# Patient Record
Sex: Male | Born: 1935 | Race: White | Hispanic: No | Marital: Married | Smoking: Former smoker
Health system: Southern US, Community
[De-identification: ages and names within clinical notes are randomized; demographics above are authoritative.]

## PROBLEM LIST (undated history)

## (undated) DIAGNOSIS — IMO0002 Reserved for concepts with insufficient information to code with codable children: Secondary | ICD-10-CM

## (undated) DIAGNOSIS — I219 Acute myocardial infarction, unspecified: Secondary | ICD-10-CM

## (undated) DIAGNOSIS — R109 Unspecified abdominal pain: Secondary | ICD-10-CM

## (undated) DIAGNOSIS — M4800 Spinal stenosis, site unspecified: Secondary | ICD-10-CM

## (undated) DIAGNOSIS — I4891 Unspecified atrial fibrillation: Secondary | ICD-10-CM

## (undated) DIAGNOSIS — I714 Abdominal aortic aneurysm, without rupture: Secondary | ICD-10-CM

## (undated) DIAGNOSIS — T7840XA Allergy, unspecified, initial encounter: Secondary | ICD-10-CM

## (undated) DIAGNOSIS — I2510 Atherosclerotic heart disease of native coronary artery without angina pectoris: Secondary | ICD-10-CM

## (undated) DIAGNOSIS — J029 Acute pharyngitis, unspecified: Secondary | ICD-10-CM

## (undated) DIAGNOSIS — M1990 Unspecified osteoarthritis, unspecified site: Secondary | ICD-10-CM

## (undated) DIAGNOSIS — J45909 Unspecified asthma, uncomplicated: Secondary | ICD-10-CM

## (undated) DIAGNOSIS — D126 Benign neoplasm of colon, unspecified: Secondary | ICD-10-CM

## (undated) DIAGNOSIS — I10 Essential (primary) hypertension: Secondary | ICD-10-CM

## (undated) DIAGNOSIS — N189 Chronic kidney disease, unspecified: Secondary | ICD-10-CM

## (undated) DIAGNOSIS — N400 Enlarged prostate without lower urinary tract symptoms: Secondary | ICD-10-CM

## (undated) DIAGNOSIS — R809 Proteinuria, unspecified: Secondary | ICD-10-CM

## (undated) DIAGNOSIS — E785 Hyperlipidemia, unspecified: Secondary | ICD-10-CM

## (undated) DIAGNOSIS — I639 Cerebral infarction, unspecified: Secondary | ICD-10-CM

## (undated) DIAGNOSIS — J189 Pneumonia, unspecified organism: Secondary | ICD-10-CM

## (undated) DIAGNOSIS — I6529 Occlusion and stenosis of unspecified carotid artery: Secondary | ICD-10-CM

## (undated) DIAGNOSIS — D649 Anemia, unspecified: Secondary | ICD-10-CM

## (undated) DIAGNOSIS — I771 Stricture of artery: Secondary | ICD-10-CM

## (undated) DIAGNOSIS — J449 Chronic obstructive pulmonary disease, unspecified: Secondary | ICD-10-CM

## (undated) DIAGNOSIS — I509 Heart failure, unspecified: Secondary | ICD-10-CM

## (undated) DIAGNOSIS — C801 Malignant (primary) neoplasm, unspecified: Secondary | ICD-10-CM

## (undated) DIAGNOSIS — M94261 Chondromalacia, right knee: Secondary | ICD-10-CM

## (undated) HISTORY — DX: Pneumonia, unspecified organism: J18.9

## (undated) HISTORY — DX: Chronic obstructive pulmonary disease, unspecified: J44.9

## (undated) HISTORY — DX: Heart failure, unspecified: I50.9

## (undated) HISTORY — DX: Acute myocardial infarction, unspecified: I21.9

## (undated) HISTORY — DX: Stricture of artery: I77.1

## (undated) HISTORY — DX: Proteinuria, unspecified: R80.9

## (undated) HISTORY — DX: Benign neoplasm of colon, unspecified: D12.6

## (undated) HISTORY — DX: Reserved for concepts with insufficient information to code with codable children: IMO0002

## (undated) HISTORY — DX: Abdominal aortic aneurysm, without rupture: I71.4

## (undated) HISTORY — DX: Malignant (primary) neoplasm, unspecified: C80.1

## (undated) HISTORY — DX: Chondromalacia, right knee: M94.261

## (undated) HISTORY — PX: TURP VAPORIZATION: SUR1397

## (undated) HISTORY — DX: Unspecified atrial fibrillation: I48.91

## (undated) HISTORY — DX: Benign prostatic hyperplasia without lower urinary tract symptoms: N40.0

## (undated) HISTORY — DX: Allergy, unspecified, initial encounter: T78.40XA

## (undated) HISTORY — DX: Spinal stenosis, site unspecified: M48.00

## (undated) HISTORY — DX: Unspecified asthma, uncomplicated: J45.909

## (undated) HISTORY — PX: FRACTURE SURGERY: SHX138

## (undated) HISTORY — DX: Cerebral infarction, unspecified: I63.9

## (undated) HISTORY — DX: Atherosclerotic heart disease of native coronary artery without angina pectoris: I25.10

## (undated) HISTORY — DX: Occlusion and stenosis of unspecified carotid artery: I65.29

## (undated) HISTORY — DX: Anemia, unspecified: D64.9

## (undated) HISTORY — DX: Chronic kidney disease, unspecified: N18.9

## (undated) HISTORY — DX: Acute pharyngitis, unspecified: J02.9

## (undated) HISTORY — DX: Unspecified osteoarthritis, unspecified site: M19.90

## (undated) HISTORY — PX: CAROTID ENDARTERECTOMY: SUR193

## (undated) HISTORY — PX: COLONOSCOPY: SHX174

## (undated) HISTORY — DX: Essential (primary) hypertension: I10

## (undated) HISTORY — DX: Unspecified abdominal pain: R10.9

## (undated) HISTORY — DX: Hyperlipidemia, unspecified: E78.5

---

## 1983-07-04 DIAGNOSIS — IMO0002 Reserved for concepts with insufficient information to code with codable children: Secondary | ICD-10-CM

## 1983-07-04 HISTORY — DX: Reserved for concepts with insufficient information to code with codable children: IMO0002

## 1999-02-01 HISTORY — PX: COLON SURGERY: SHX602

## 1999-03-04 HISTORY — PX: OTHER SURGICAL HISTORY: SHX169

## 1999-05-04 DIAGNOSIS — J449 Chronic obstructive pulmonary disease, unspecified: Secondary | ICD-10-CM

## 1999-05-04 HISTORY — DX: Chronic obstructive pulmonary disease, unspecified: J44.9

## 2002-11-01 HISTORY — PX: CORONARY ARTERY BYPASS GRAFT: SHX141

## 2003-05-12 HISTORY — PX: NEPHRECTOMY: SHX65

## 2003-07-04 DIAGNOSIS — I714 Abdominal aortic aneurysm, without rupture, unspecified: Secondary | ICD-10-CM

## 2003-07-04 HISTORY — DX: Abdominal aortic aneurysm, without rupture, unspecified: I71.40

## 2003-07-04 HISTORY — DX: Abdominal aortic aneurysm, without rupture: I71.4

## 2004-08-16 DIAGNOSIS — N189 Chronic kidney disease, unspecified: Secondary | ICD-10-CM

## 2004-08-16 HISTORY — DX: Chronic kidney disease, unspecified: N18.9

## 2007-05-16 HISTORY — PX: ESOPHAGOGASTRODUODENOSCOPY: SHX1529

## 2008-01-13 HISTORY — PX: LAPAROSCOPIC PARTIAL COLECTOMY: SHX5907

## 2010-08-04 ENCOUNTER — Ambulatory Visit
Admission: RE | Admit: 2010-08-04 | Discharge: 2010-08-04 | Disposition: A | Discharge status: Home / Self Care | Payer: Medicare Other | Source: Ambulatory Visit | Attending: Orthopedic Surgery | Admitting: Orthopedic Surgery

## 2010-08-04 ENCOUNTER — Other Ambulatory Visit: Payer: Self-pay | Admitting: Orthopedic Surgery

## 2010-08-04 DIAGNOSIS — M79605 Pain in left leg: Secondary | ICD-10-CM

## 2012-05-07 HISTORY — PX: JOINT REPLACEMENT: SHX530

## 2012-09-09 IMAGING — US US EXTREM LOW VENOUS*L*
1 series · 14 of 24 positions shown · non-contrast
Comparison: None.

CLINICAL DATA: Pain

LEFT LOWER EXTREMITY VENOUS DUPLEX ULTRASOUND
TECHNIQUE: Gray-scale sonography with graded compression, as well
as color Doppler and duplex ultrasound, were performed to evaluate
the deep venous system of the lower extremity from the level of the
common femoral vein through the popliteal and proximal calf veins.
Spectral Doppler was utilized to evaluate flow at rest and with
distal augmentation maneuvers.

[Series 1: us extrem low venous*left* · 14 of 31 slices shown]
[im 1/31]
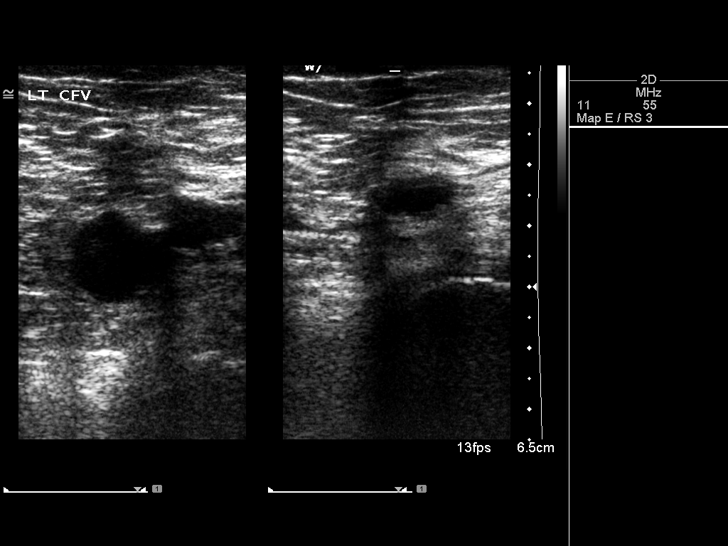
[im 3/31]
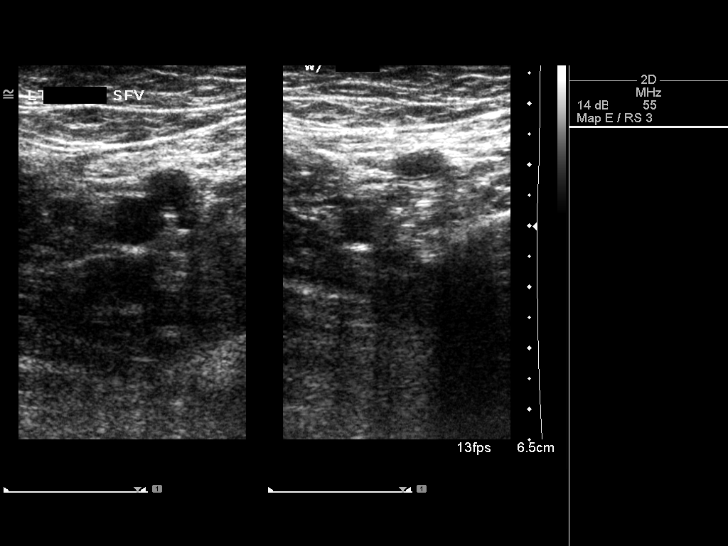
[im 6/31]
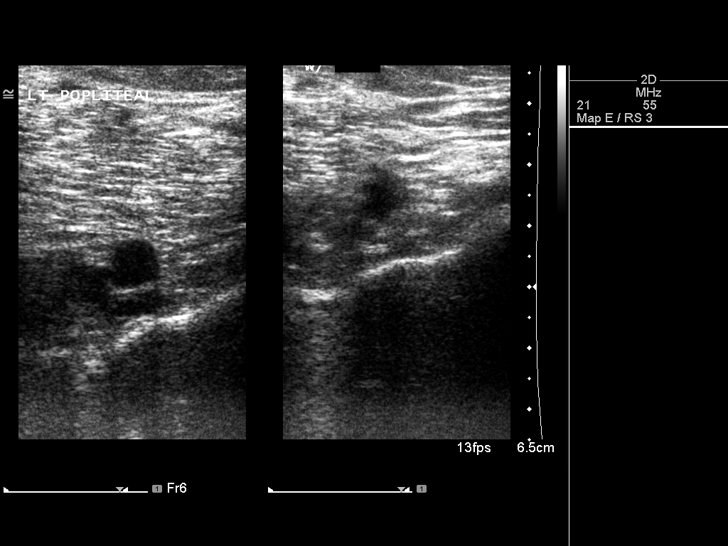
[im 8/31]
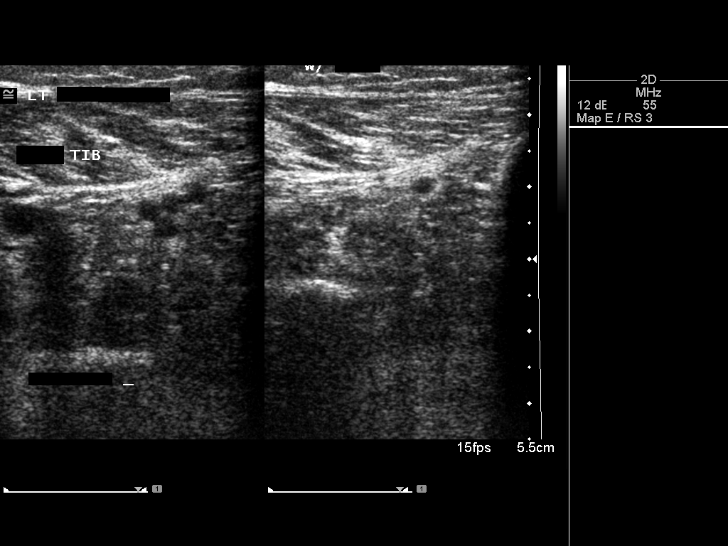
[im 10/31]
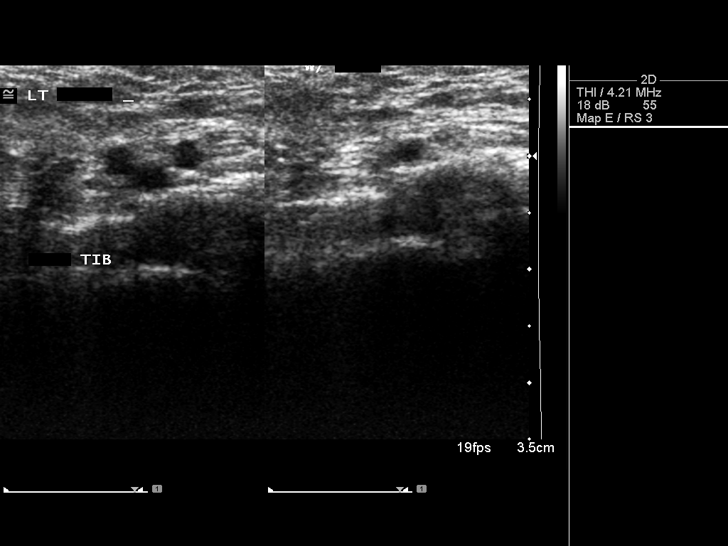
[im 12/31]
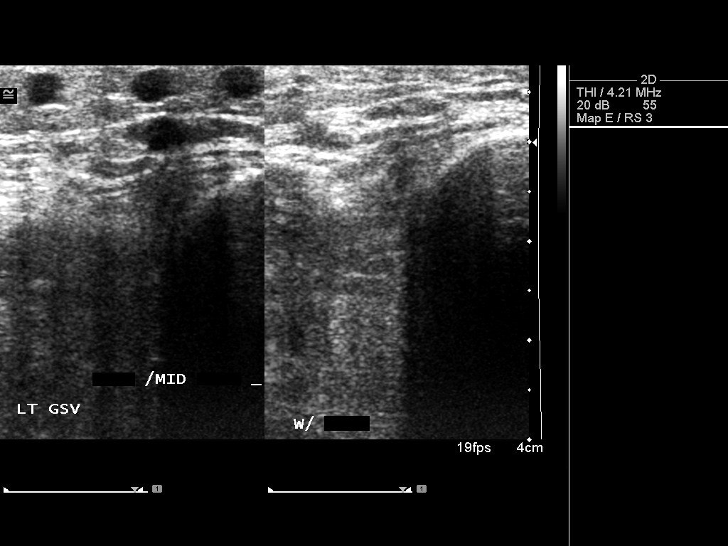
[im 15/31]
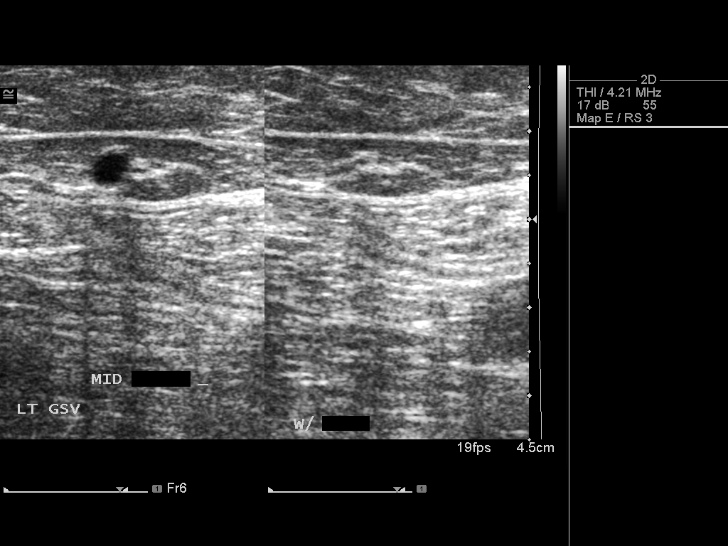
[im 16/31]
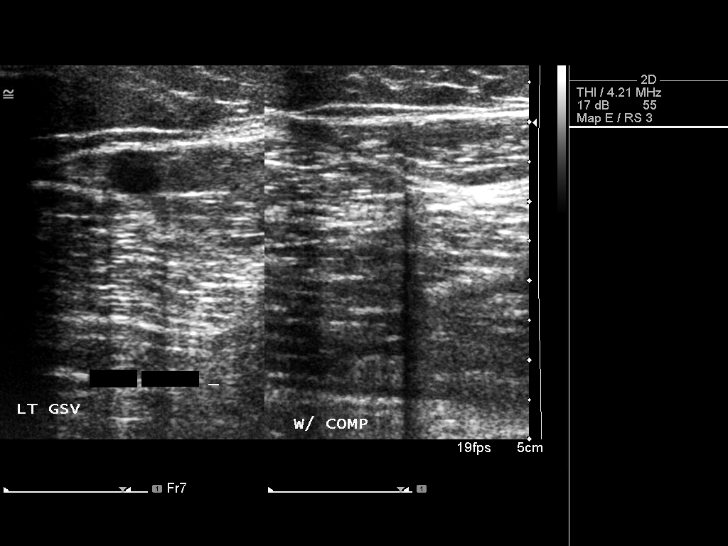
[im 19/31]
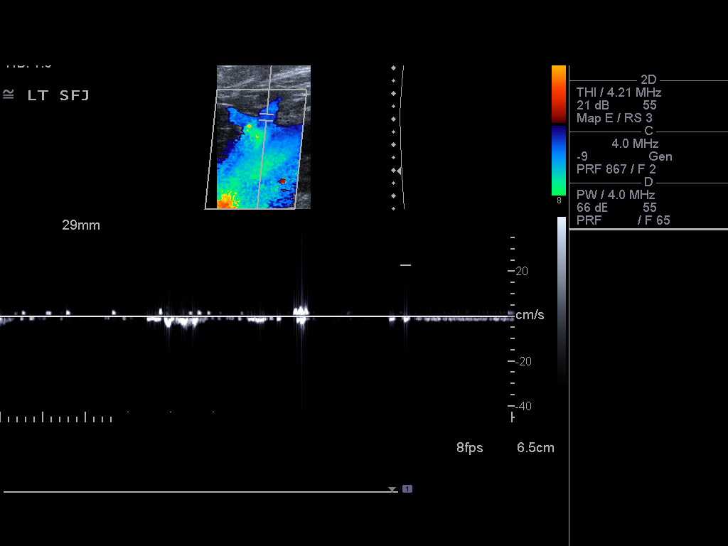
[im 21/31]
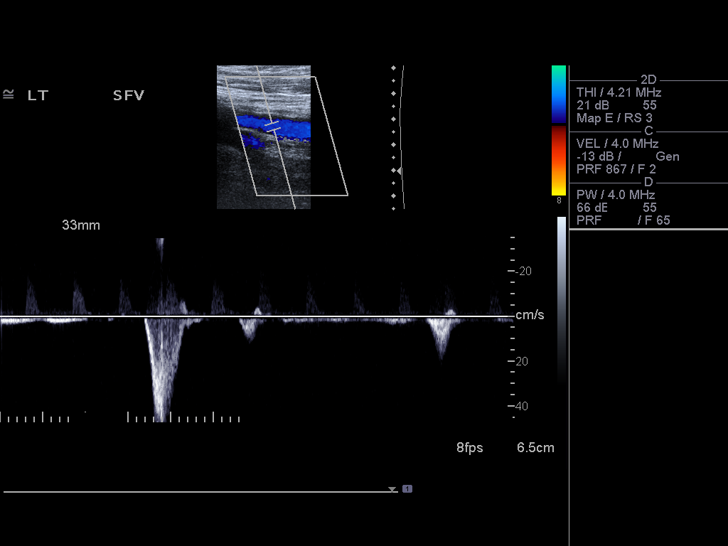
[im 24/31]
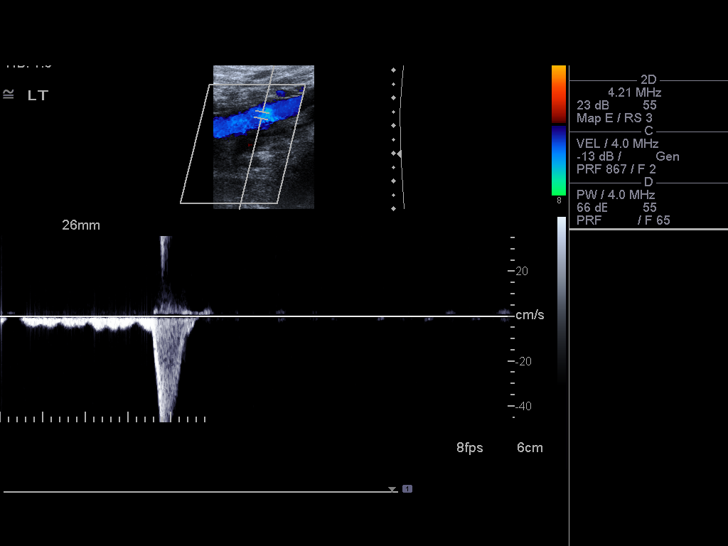
[im 25/31]
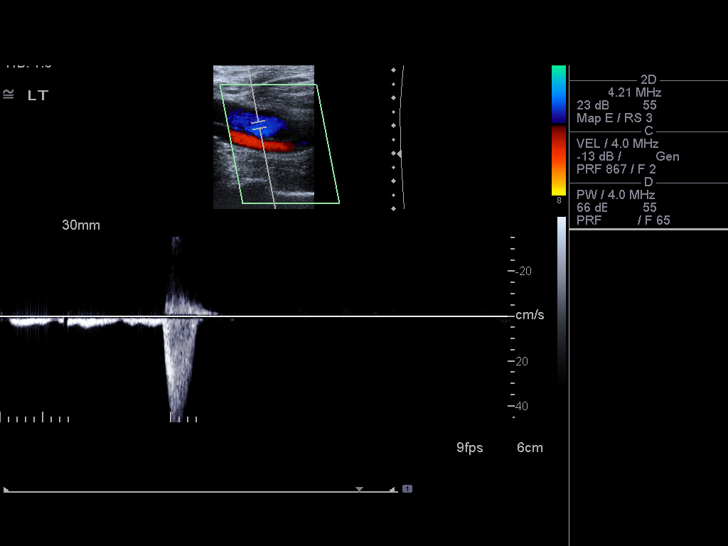
[im 28/31]
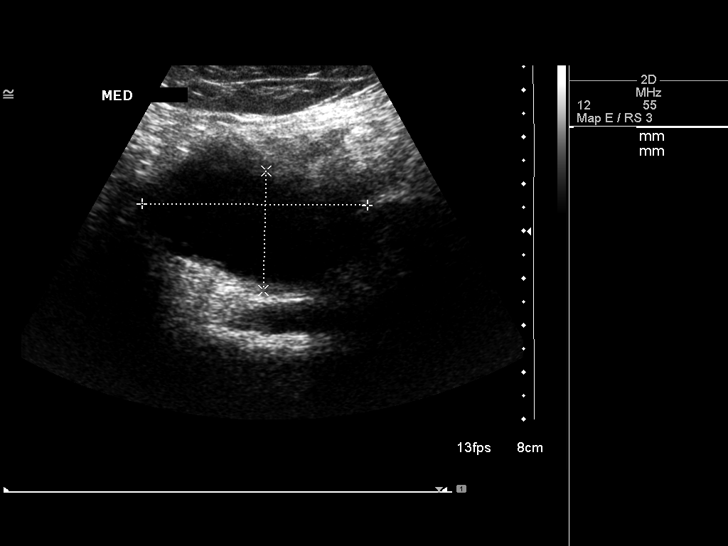
[im 31/31]
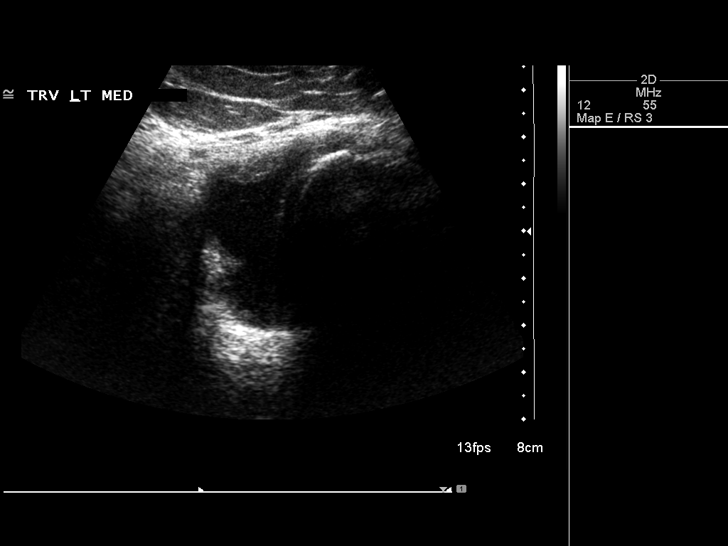

[14 of 24 positions shown; findings below may reference images not displayed]

FINDINGS: There is complete compressibility of the left common
femoral, femoral, and popliteal veins.  Doppler analysis
demonstrates respiratory phasicity and augmentation of flow upon
calf compression.

Multiple varicosities are seen along the medial thigh, adjacent to
the the greater saphenous vein.

A 4.8 x 2.5 x 2.4 cm fluid collection is seen in the popliteal
fossa.
IMPRESSION: No evidence of DVT.

Varicosities are present.  Symptoms may be related to venous
insufficiency.

Large Baker's cyst.

## 2012-09-11 ENCOUNTER — Encounter: Payer: Self-pay | Admitting: Vascular Surgery

## 2012-09-23 ENCOUNTER — Encounter: Payer: Self-pay | Admitting: Vascular Surgery

## 2012-09-24 ENCOUNTER — Encounter: Payer: Self-pay | Admitting: Vascular Surgery

## 2012-09-24 ENCOUNTER — Ambulatory Visit (INDEPENDENT_AMBULATORY_CARE_PROVIDER_SITE_OTHER): Payer: Medicare Other | Admitting: Vascular Surgery

## 2012-09-24 VITALS — BP 114/67 | HR 58 | Ht 71.0 in | Wt 202.0 lb

## 2012-09-24 DIAGNOSIS — I6529 Occlusion and stenosis of unspecified carotid artery: Secondary | ICD-10-CM

## 2012-09-24 DIAGNOSIS — I739 Peripheral vascular disease, unspecified: Secondary | ICD-10-CM

## 2012-09-24 DIAGNOSIS — I714 Abdominal aortic aneurysm, without rupture, unspecified: Secondary | ICD-10-CM | POA: Insufficient documentation

## 2012-09-24 NOTE — Progress Notes (Signed)
[facility] [place]   Patient name: James Barton MRN: [id number] DOB: 08/17/1934 Sex: male   Referred by: Dr Julius Bowels  Reason for referral:  Chief Complaint  Patient presents with  . New Evaluation    AAA, PVD - Dr. Albertina Senegal - pt states that he would like to establish care here due to him not being comfortable with previous vascular surgeon at [facility]    HISTORY OF PRESENT ILLNESS: The patient is a very pleasant 78 year old gentleman with a history of diffuse peripheral vascular occlusive disease. He did undergo uneventful right carotid endarterectomy by Dr. Mayford Knife and [place] in the mid 1990s. He does have a small abdominal aortic aneurysm. He also has some left suprasternal artery occlusive disease and also moderate left carotid stenosis which is asymptomatic. He has been undergoing appropriate surveillance of this. He has requested that he transferred his vascular surgical care to our practice and we have agreed. He specifically denies any prior symptoms of carotid stroke, TIA or amaurosis fugax. He does have very mild left calf claudication but is limited more by arthritic issues in his knees. He has had a left total knee replacement and does have some disability in the right knee. He has no symptoms referable to a small aneurysm.  Past Medical History  Diagnosis Date  . AAA (abdominal aortic aneurysm) 07/04/2003    3.1 cm  . Abdominal pain   . Acute pharyngitis   . Allergy     Rhinitis  . Anemia   . Carotid artery occlusion   . Benign neoplasm of colon   . Prostatic hypertrophy   . Chondromalacia of right knee   . Asthma     Asthmatic Bronchitis  . Coronary artery disease   . Hypertension   . Hyperlipidemia   . Chronic kidney disease 08/16/04    and ureter, cyst,Malignant neoplasm of kidney  . Arthritis     Osteoarthritis, Right knee  . Ulcer 1985    Peptic w/o hemorrhage  . Proteinuria   . COPD (chronic obstructive pulmonary  disease) Nov. 2000    Pulmonary nodule  . Spinal stenosis   . Stricture of artery Left  . Myocardial infarction   . Pneumonia   . Atrial fibrillation   . CHF (congestive heart failure)   . Stroke   . CAD (coronary artery disease)   . Cancer     renal cell    Past Surgical History  Procedure Laterality Date  . Esophagogastroduodenoscopy  Nov. 13, 2008  . Colon surgery  Aug. 1, 2000    Benign neoplasm of colon at the ileocecal valve  . Colonoscopy  2009-2010-2012  . Fracture surgery Left     Ulna of the left radius  . Carotid thromboendarterectomy Right Sept. 2000  . Laparoscopic partial colectomy  January 13, 2008    W/Removal Terminal ileum, ilecolost   . Nephrectomy Right Nov. 9, 2004    Renal tumor  . Joint replacement Left Nov. 5, 2013    Knee  . Coronary artery bypass graft  Nov 01, 2002    X6    History   Social History  . Marital Status: Married    Spouse Name: N/A    Number of Children: N/A  . Years of Education: N/A   Occupational History  . Not on file.   Social History Main Topics  . Smoking status: Former Smoker    Types: Cigarettes    Quit date: 07/03/2002  . Smokeless tobacco:  Never Used  . Alcohol Use: No  . Drug Use: No  . Sexually Active: Not on file   Other Topics Concern  . Not on file   Social History Narrative  . No narrative on file    Family History  Problem Relation Age of Onset  . Heart disease Mother     before age 45  . Stroke Mother   . Hyperlipidemia Mother   . Hypertension Mother   . Other Mother     varicose veins  . Heart disease Father   . Stroke Father   . Cancer Father     Allergies as of 09/24/2012 - Review Complete 09/24/2012  Allergen Reaction Noted  . Penicillins  09/11/2012    Current Outpatient Prescriptions on File Prior to Visit  Medication Sig Dispense Refill  . amLODipine-benazepril (LOTREL) 5-10 MG per capsule Take 1 capsule by mouth daily.      Marland Kitchen aspirin 81 MG tablet Take 81 mg by mouth daily.       Marland Kitchen atenolol (TENORMIN) 100 MG tablet Take 100 mg by mouth daily.      . cetirizine (ZYRTEC) 10 MG tablet Take 10 mg by mouth daily.      . clopidogrel (PLAVIX) 75 MG tablet Take 75 mg by mouth daily.      . colesevelam (WELCHOL) 625 MG tablet Take 1,875 mg by mouth 2 (two) times daily with a meal.      . glucosamine-chondroitin 500-400 MG tablet Take 1 tablet by mouth 3 (three) times daily.      . nitroGLYCERIN (NITROSTAT) 0.4 MG SL tablet Place 0.4 mg under the tongue every 5 (five) minutes as needed for chest pain.      . Omega-3 Fat Ac-Cholecalciferol (MINICAPS VITAMIN-D/OMEGA-3) [phone] MG-UNIT CAPS Take 2,000 Units by mouth.      . Probiotic Product (PROBIOTIC DAILY PO) Take by mouth.      . ranitidine (ZANTAC) 150 MG capsule Take 150 mg by mouth 2 (two) times daily.      . rosuvastatin (CRESTOR) 20 MG tablet Take 20 mg by mouth daily.       No current facility-administered medications on file prior to visit.     REVIEW OF SYSTEMS:  Positives indicated with an "X"  CARDIOVASCULAR:  [ ]  chest pain   [ ]  chest pressure   [ ]  palpitations   [ ]  orthopnea   [ ]  dyspnea on exertion   [ ]  claudication   [ ]  rest pain   [ ]  DVT   [ ]  phlebitis PULMONARY:   [ ]  productive cough   [ ]  asthma   [ ]  wheezing NEUROLOGIC:   [ ]  weakness  [ ]  paresthesias  [ ]  aphasia  [ ]  amaurosis  [ ]  dizziness HEMATOLOGIC:   [ ]  bleeding problems   [ ]  clotting disorders MUSCULOSKELETAL:  [ ]  joint pain   [ ]  joint swelling GASTROINTESTINAL: [ ]   blood in stool  [ ]   hematemesis GENITOURINARY:  [ ]   dysuria  [ ]   hematuria PSYCHIATRIC:  [ ]  history of major depression INTEGUMENTARY:  [ ]  rashes  [ ]  ulcers CONSTITUTIONAL:  [ ]  fever   [ ]  chills  PHYSICAL EXAMINATION:  General: The patient is a well-nourished male, in no acute distress. Vital signs are BP 114/67  Pulse 58  Ht 5\' 11"  (1.803 m)  Wt 202 lb (91.627 kg)  BMI 28.19 kg/m2  SpO2 96% Pulmonary: There is a  good air exchange bilaterally  without wheezing or rales. Abdomen: Soft and non-tender with normal pitch bowel sounds. I do not palpate an aneurysm Musculoskeletal: There are no major deformities.  There is no significant extremity pain. Neurologic: No focal weakness or paresthesias are detected, Skin: There are no ulcer or rashes noted. Psychiatric: The patient has normal affect. Cardiovascular: There is a regular rate and rhythm without significant murmur appreciated. Well-healed right carotid incision with no bruits bilaterally Pulse status: Palpable radial femoral popliteal and dorsalis pedis pulses bilaterally with no evidence of peripheral aneurysm   Vascular Lab Studies:  I have his study from February of this year of his carotid duplex, abdominal ultrasound and lower surety arterial studies. This shows no evidence of right carotid stenosis and 4059% left carotid stenosis. He has a 3.07 meter infrarenal abdominal aortic aneurysm. His right ankle arm index is normal his left ankle arm index is reduced at 0.71 related to superficial artery occlusive disease.  Impression and Plan:  Stable diffuse peripheral vascular disease. I along discussion with the patient regarding all of these issues. I've recommended yearly followup of his aorta and carotid stenosis. I reviewed symptoms of both of these disease processes notify us if he should this occur. Otherwise we'll see him in one year from February for repeat study    Letina Luckett [facility] [place] Office: [phone]

## 2012-09-24 NOTE — Addendum Note (Signed)
Addended by: Dannielle Karvonen on: 09/24/2012 02:01 PM   Modules accepted: Orders

## 2012-10-08 ENCOUNTER — Encounter: Payer: PRIVATE HEALTH INSURANCE | Admitting: Vascular Surgery

## 2013-06-18 ENCOUNTER — Other Ambulatory Visit: Payer: Self-pay | Admitting: Vascular Surgery

## 2013-06-18 DIAGNOSIS — Z48812 Encounter for surgical aftercare following surgery on the circulatory system: Secondary | ICD-10-CM

## 2013-08-25 ENCOUNTER — Encounter: Payer: Self-pay | Admitting: Vascular Surgery

## 2013-08-26 ENCOUNTER — Ambulatory Visit: Payer: Medicare Other | Admitting: Vascular Surgery

## 2013-08-26 ENCOUNTER — Other Ambulatory Visit (HOSPITAL_COMMUNITY): Payer: Medicare Other

## 2013-10-20 ENCOUNTER — Encounter: Payer: Self-pay | Admitting: Vascular Surgery

## 2013-10-21 ENCOUNTER — Encounter: Payer: Self-pay | Admitting: Vascular Surgery

## 2013-10-21 ENCOUNTER — Ambulatory Visit (INDEPENDENT_AMBULATORY_CARE_PROVIDER_SITE_OTHER)
Admission: RE | Admit: 2013-10-21 | Discharge: 2013-10-21 | Disposition: A | Discharge status: Home / Self Care | Payer: Medicare Other | Source: Ambulatory Visit | Attending: Vascular Surgery | Admitting: Vascular Surgery

## 2013-10-21 ENCOUNTER — Ambulatory Visit (INDEPENDENT_AMBULATORY_CARE_PROVIDER_SITE_OTHER): Payer: Medicare Other | Admitting: Vascular Surgery

## 2013-10-21 ENCOUNTER — Ambulatory Visit (HOSPITAL_COMMUNITY)
Admission: RE | Admit: 2013-10-21 | Discharge: 2013-10-21 | Disposition: A | Discharge status: Home / Self Care | Payer: Medicare Other | Source: Ambulatory Visit | Attending: Vascular Surgery | Admitting: Vascular Surgery

## 2013-10-21 VITALS — BP 149/87 | HR 51 | Ht 71.0 in | Wt 204.5 lb

## 2013-10-21 DIAGNOSIS — I714 Abdominal aortic aneurysm, without rupture, unspecified: Secondary | ICD-10-CM

## 2013-10-21 DIAGNOSIS — I6529 Occlusion and stenosis of unspecified carotid artery: Secondary | ICD-10-CM

## 2013-10-21 DIAGNOSIS — Z48812 Encounter for surgical aftercare following surgery on the circulatory system: Secondary | ICD-10-CM

## 2013-10-21 DIAGNOSIS — I6523 Occlusion and stenosis of bilateral carotid arteries: Secondary | ICD-10-CM

## 2013-10-21 NOTE — Progress Notes (Signed)
Here today for followup of his diffuse peripheral vascular occlusive disease. He has a history of known small infrarenal abdominal aortic aneurysm. He is status post right carotid endarterectomy in 2000 and [place]. He has no symptoms. He denies any neurologic deficits.   Past Medical History  Diagnosis Date  . AAA (abdominal aortic aneurysm) 07/04/2003    3.1 cm  . Abdominal pain   . Acute pharyngitis   . Allergy     Rhinitis  . Anemia   . Carotid artery occlusion   . Benign neoplasm of colon   . Prostatic hypertrophy   . Chondromalacia of right knee   . Asthma     Asthmatic Bronchitis  . Coronary artery disease   . Hypertension   . Hyperlipidemia   . Chronic kidney disease 08/16/04    and ureter, cyst,Malignant neoplasm of kidney  . Arthritis     Osteoarthritis, Right knee  . Ulcer 1985    Peptic w/o hemorrhage  . Proteinuria   . COPD (chronic obstructive pulmonary disease) Nov. 2000    Pulmonary nodule  . Spinal stenosis   . Stricture of artery Left  . Myocardial infarction   . Pneumonia   . Atrial fibrillation   . CHF (congestive heart failure)   . Stroke   . CAD (coronary artery disease)   . Cancer     renal cell    History  Substance Use Topics  . Smoking status: Former Smoker    Types: Cigarettes    Quit date: 07/03/2002  . Smokeless tobacco: Never Used  . Alcohol Use: No    Family History  Problem Relation Age of Onset  . Heart disease Mother     before age 85  . Stroke Mother   . Hyperlipidemia Mother   . Hypertension Mother   . Other Mother     varicose veins  . Heart disease Father   . Stroke Father   . Cancer Father     Allergies  Allergen Reactions  . Penicillins     Pt states when he was a child he had a reaction but has not had one since    Current outpatient prescriptions:amLODipine-benazepril (LOTREL) 5-10 MG per capsule, Take 1 capsule by mouth daily., Disp: , Rfl: ;  aspirin 81 MG tablet, Take 81 mg by mouth  daily., Disp: , Rfl: ;  atenolol (TENORMIN) 100 MG tablet, Take 100 mg by mouth daily., Disp: , Rfl: ;  cetirizine (ZYRTEC) 10 MG tablet, Take 10 mg by mouth daily., Disp: , Rfl: ;  clopidogrel (PLAVIX) 75 MG tablet, Take 75 mg by mouth daily., Disp: , Rfl:  nitroGLYCERIN (NITROSTAT) 0.4 MG SL tablet, Place 0.4 mg under the tongue every 5 (five) minutes as needed for chest pain., Disp: , Rfl: ;  Probiotic Product (PROBIOTIC DAILY PO), Take by mouth., Disp: , Rfl: ;  ranitidine (ZANTAC) 150 MG capsule, Take 150 mg by mouth 2 (two) times daily., Disp: , Rfl: ;  rosuvastatin (CRESTOR) 20 MG tablet, Take 20 mg by mouth daily., Disp: , Rfl:  colesevelam (WELCHOL) 625 MG tablet, Take 1,875 mg by mouth 2 (two) times daily with a meal., Disp: , Rfl: ;  glucosamine-chondroitin 500-400 MG tablet, Take 1 tablet by mouth 3 (three) times daily., Disp: , Rfl: ;  Omega-3 Fat Ac-Cholecalciferol (MINICAPS VITAMIN-D/OMEGA-3) [phone] MG-UNIT CAPS, Take 2,000 Units by mouth., Disp: , Rfl:   BP 149/87  Pulse 51  Ht 5\' 11"  (1.803 m)  Wt  204 lb 8 oz (92.761 kg)  BMI 28.53 kg/m2  SpO2 100%  Body mass index is 28.53 kg/(m^2).       Physical exam well-developed well-nourished gentleman appearing stated age Grossly intact neurologically Carotid arteries without bruits bilaterally with well-healed right neck incision Heart regular rate and rhythm without murmur Abdomen soft nontender no masses noted  Vascular lab studies today reveal no change in his aneurysm at 3.0 cm Carotid duplex reveals mild thickening is endarterectomy site of the right and less than 40% narrowing on the left internal carotid artery  Impression and plan: Stable peripheral vascular occlusive disease. Discussed symptoms carotid disease and aneurysm the patient explained to be extremely unlikely. He will seek medical attention immediately should this occur. Otherwise we will see him again in one year with repeat vascular lab studies

## 2013-10-21 NOTE — Addendum Note (Signed)
Addended by: Dorthula Rue L on: 10/21/2013 03:50 PM   Modules accepted: Orders

## 2014-10-27 ENCOUNTER — Ambulatory Visit: Payer: Medicare Other | Admitting: Vascular Surgery

## 2014-10-27 ENCOUNTER — Other Ambulatory Visit (HOSPITAL_COMMUNITY): Payer: Medicare Other

## 2014-12-18 ENCOUNTER — Encounter: Payer: Self-pay | Admitting: Vascular Surgery

## 2014-12-21 ENCOUNTER — Other Ambulatory Visit: Payer: Self-pay | Admitting: Vascular Surgery

## 2014-12-21 DIAGNOSIS — I6523 Occlusion and stenosis of bilateral carotid arteries: Secondary | ICD-10-CM

## 2014-12-21 DIAGNOSIS — I714 Abdominal aortic aneurysm, without rupture, unspecified: Secondary | ICD-10-CM

## 2014-12-21 DIAGNOSIS — Z48812 Encounter for surgical aftercare following surgery on the circulatory system: Secondary | ICD-10-CM

## 2014-12-22 ENCOUNTER — Ambulatory Visit (INDEPENDENT_AMBULATORY_CARE_PROVIDER_SITE_OTHER)
Admission: RE | Admit: 2014-12-22 | Discharge: 2014-12-22 | Disposition: A | Discharge status: Home / Self Care | Payer: Medicare Other | Source: Ambulatory Visit | Attending: Vascular Surgery | Admitting: Vascular Surgery

## 2014-12-22 ENCOUNTER — Ambulatory Visit (HOSPITAL_COMMUNITY)
Admission: RE | Admit: 2014-12-22 | Discharge: 2014-12-22 | Disposition: A | Discharge status: Home / Self Care | Payer: Medicare Other | Source: Ambulatory Visit | Attending: Vascular Surgery | Admitting: Vascular Surgery

## 2014-12-22 ENCOUNTER — Encounter: Payer: Self-pay | Admitting: Vascular Surgery

## 2014-12-22 ENCOUNTER — Ambulatory Visit (INDEPENDENT_AMBULATORY_CARE_PROVIDER_SITE_OTHER): Payer: Medicare Other | Admitting: Vascular Surgery

## 2014-12-22 VITALS — BP 158/80 | HR 80 | Resp 49 | Ht 70.5 in | Wt 202.7 lb

## 2014-12-22 DIAGNOSIS — Z48812 Encounter for surgical aftercare following surgery on the circulatory system: Secondary | ICD-10-CM

## 2014-12-22 DIAGNOSIS — I6523 Occlusion and stenosis of bilateral carotid arteries: Secondary | ICD-10-CM

## 2014-12-22 DIAGNOSIS — I714 Abdominal aortic aneurysm, without rupture, unspecified: Secondary | ICD-10-CM

## 2014-12-22 NOTE — Progress Notes (Signed)
Filed Vitals:   12/22/14 1023 12/22/14 1040  BP: 177/81 158/80  Pulse: 49 80  Resp: 18 49  Height: 5' 10.5" (1.791 m)   Weight: 202 lb 11.2 oz (91.944 kg)   Body mass index is 28.66 kg/(m^2).

## 2014-12-22 NOTE — Addendum Note (Signed)
Addended by: Mena Goes on: 12/22/2014 11:28 AM   Modules accepted: Orders

## 2014-12-22 NOTE — Progress Notes (Signed)
He is today for continued discussion of his small abdominal aortic aneurysm and for follow-up of his left carotid endarterectomy get an outlying facility in 2000. He remains quite active at his age of 80. He's had no new health issues. Specifically no cardiac or peripheral vascular disease. He's had no stroke symptoms.  Past Medical History  Diagnosis Date  . AAA (abdominal aortic aneurysm) 07/04/2003    3.1 cm  . Abdominal pain   . Acute pharyngitis   . Allergy     Rhinitis  . Anemia   . Carotid artery occlusion   . Benign neoplasm of colon   . Prostatic hypertrophy   . Chondromalacia of right knee   . Asthma     Asthmatic Bronchitis  . Coronary artery disease   . Hypertension   . Hyperlipidemia   . Chronic kidney disease 08/16/04    and ureter, cyst,Malignant neoplasm of kidney  . Arthritis     Osteoarthritis, Right knee  . Ulcer 1985    Peptic w/o hemorrhage  . Proteinuria   . COPD (chronic obstructive pulmonary disease) Nov. 2000    Pulmonary nodule  . Spinal stenosis   . Stricture of artery Left  . Myocardial infarction   . Pneumonia   . Atrial fibrillation   . CHF (congestive heart failure)   . Stroke   . CAD (coronary artery disease)   . Cancer     renal cell    History  Substance Use Topics  . Smoking status: Former Smoker    Types: Cigarettes    Quit date: 07/03/2002  . Smokeless tobacco: Never Used  . Alcohol Use: No    Family History  Problem Relation Age of Onset  . Heart disease Mother     before age 39  . Stroke Mother   . Hyperlipidemia Mother   . Hypertension Mother   . Other Mother     varicose veins  . Heart disease Father   . Stroke Father   . Cancer Father     Allergies  Allergen Reactions  . Penicillins     Pt states when he was a child he had a reaction but has not had one since     Current outpatient prescriptions:  .  amLODipine-benazepril (LOTREL) 5-10 MG per capsule, Take 1 capsule by mouth daily., Disp: , Rfl:  .   aspirin 81 MG tablet, Take 81 mg by mouth daily., Disp: , Rfl:  .  atenolol (TENORMIN) 100 MG tablet, Take 100 mg by mouth daily., Disp: , Rfl:  .  cetirizine (ZYRTEC) 10 MG tablet, Take 10 mg by mouth daily., Disp: , Rfl:  .  clopidogrel (PLAVIX) 75 MG tablet, Take 75 mg by mouth daily., Disp: , Rfl:  .  hydrochlorothiazide (HYDRODIURIL) 25 MG tablet, Take 25 mg by mouth daily., Disp: , Rfl:  .  metoprolol succinate (TOPROL-XL) 100 MG 24 hr tablet, Take 100 mg by mouth 2 (two) times daily. Take with or immediately following a meal., Disp: , Rfl:  .  nitroGLYCERIN (NITROSTAT) 0.4 MG SL tablet, Place 0.4 mg under the tongue every 5 (five) minutes as needed for chest pain., Disp: , Rfl:  .  Omega-3 Fat Ac-Cholecalciferol (MINICAPS VITAMIN-D/OMEGA-3) [phone] MG-UNIT CAPS, Take 2,000 Units by mouth., Disp: , Rfl:  .  Probiotic Product (PROBIOTIC DAILY PO), Take by mouth., Disp: , Rfl:  .  ranitidine (ZANTAC) 150 MG capsule, Take 150 mg by mouth 2 (two) times daily., Disp: , Rfl:  .  rosuvastatin (CRESTOR) 20 MG tablet, Take 20 mg by mouth daily., Disp: , Rfl:  .  colesevelam (WELCHOL) 625 MG tablet, Take 1,875 mg by mouth 2 (two) times daily with a meal., Disp: , Rfl:  .  glucosamine-chondroitin 500-400 MG tablet, Take 1 tablet by mouth 3 (three) times daily., Disp: , Rfl:   Filed Vitals:   12/22/14 1023 12/22/14 1040  BP: 177/81 158/80  Pulse: 49 80  Resp: 18 49  Height: 5' 10.5" (1.791 m)   Weight: 202 lb 11.2 oz (91.944 kg)     Body mass index is 28.66 kg/(m^2).       On physical exam is well-developed well-nourished gentleman acute distress Left neck incision is well-healed and he has no bruits bilaterally Pression for equal in nonlabored Abdomen is moderately obese with no tenderness and no palpable aneurysm palpable 2+ posterior tibial pulses bilaterally  Vascular lab today was reviewed with the patient. This reveals maximal diameter of 3.2 cm of his infrarenal aneurysm as  compared to 3. [phone].  Carotid studies reveal no evidence of internal carotid artery stenosis bilaterally  Impression and plan stable vascular follow-up. Discussed this with patient regarding his aorta and carotids. Recommend dropping back to ultrasound every 2 years. Again discussed symptoms of leaking aneurysm explained this would be extremely unlikely. We'll see him again in 2 years

## 2016-12-11 ENCOUNTER — Encounter: Payer: Self-pay | Admitting: Family

## 2016-12-19 ENCOUNTER — Ambulatory Visit (INDEPENDENT_AMBULATORY_CARE_PROVIDER_SITE_OTHER)
Admission: RE | Admit: 2016-12-19 | Discharge: 2016-12-19 | Disposition: A | Discharge status: Home / Self Care | Payer: Medicare Other | Source: Ambulatory Visit | Attending: Vascular Surgery | Admitting: Vascular Surgery

## 2016-12-19 ENCOUNTER — Ambulatory Visit (INDEPENDENT_AMBULATORY_CARE_PROVIDER_SITE_OTHER): Payer: Medicare Other | Admitting: Family

## 2016-12-19 ENCOUNTER — Encounter: Payer: Self-pay | Admitting: Family

## 2016-12-19 ENCOUNTER — Ambulatory Visit (HOSPITAL_COMMUNITY)
Admission: RE | Admit: 2016-12-19 | Discharge: 2016-12-19 | Disposition: A | Discharge status: Home / Self Care | Payer: Medicare Other | Source: Ambulatory Visit | Attending: Vascular Surgery | Admitting: Vascular Surgery

## 2016-12-19 VITALS — BP 129/79 | HR 55 | Temp 97.3°F | Resp 20 | Ht 70.5 in | Wt 201.0 lb

## 2016-12-19 DIAGNOSIS — I6523 Occlusion and stenosis of bilateral carotid arteries: Secondary | ICD-10-CM | POA: Insufficient documentation

## 2016-12-19 DIAGNOSIS — I771 Stricture of artery: Secondary | ICD-10-CM

## 2016-12-19 DIAGNOSIS — Z87891 Personal history of nicotine dependence: Secondary | ICD-10-CM

## 2016-12-19 DIAGNOSIS — I714 Abdominal aortic aneurysm, without rupture, unspecified: Secondary | ICD-10-CM

## 2016-12-19 DIAGNOSIS — Z9889 Other specified postprocedural states: Secondary | ICD-10-CM

## 2016-12-19 DIAGNOSIS — I6522 Occlusion and stenosis of left carotid artery: Secondary | ICD-10-CM

## 2016-12-19 DIAGNOSIS — K551 Chronic vascular disorders of intestine: Secondary | ICD-10-CM

## 2016-12-19 LAB — VAS US CAROTID
LCCADDIAS: -18 cm/s
LCCADSYS: -134 cm/s
LCCAPDIAS: 13 cm/s
LEFT ECA DIAS: -14 cm/s
LICADDIAS: -20 cm/s
LICADSYS: -111 cm/s
LICAPDIAS: -17 cm/s
LICAPSYS: -134 cm/s
Left CCA prox sys: 102 cm/s
RCCAPSYS: 111 cm/s
RIGHT CCA MID DIAS: 17 cm/s
RIGHT ECA DIAS: 14 cm/s
Right CCA prox dias: 15 cm/s
Right cca dist sys: -88 cm/s

## 2016-12-19 NOTE — Progress Notes (Signed)
[facility] [place] HISTORY AND PHYSICAL  CC: Follow up AAA and extracranial carotid artery stenosis   History of Present Illness:   James Barton is a 82 y.o. male returns for follow up of his small abdominal aortic aneurysm and for follow-up of his left carotid endarterectomy done at an outlying facility in 2000.  James Barton remains quite active.   Dr. Donnetta Hutching last evaluated pt on 12-22-14. At that time vascular studies revealed maximal diameter of 3.2 cm of his infrarenal aneurysm as compared to 3.0 cm in 2015. Carotid studies revealed no evidence of internal carotid artery stenosis bilaterally. Dr. Donnetta Hutching recommended 2 years follow up.   His weight 2 years ago was 202#, today is 201#.  He states that he has the same bowel irregularity as his father and son, states this has been evaluated by a gastroenterologist.  Pt denies postprandial abdominal pain, denies food fear.   He states he has bilateral calf cramping after walking a mile w/o stopping.  He denies tingling or numbness, or pain, or cold sensation in either hand or arm. He states he has known about this left subclavian artery stenosis for years.   He has had surgery on his lumbar spine a few years ago.  His knee issues seem to limit his walking, but he is still able to do most of what he wants, he plays golf.   He recently had a TURP, feels he is improving since this.   Pt Diabetic: No Pt smoker: former smoker, quit in 2004, started at age 77 years. He states that he quit when he had an MI.   Pt meds include: Statin :Yes Betablocker: Yes ASA: Yes Other anticoagulants/antiplatelets: Plavix   Current Outpatient Prescriptions  Medication Sig Dispense Refill  . amLODipine-benazepril (LOTREL) 5-10 MG per capsule Take 1 capsule by mouth daily.    Marland Kitchen aspirin 81 MG tablet Take 81 mg by mouth daily.    Marland Kitchen atenolol (TENORMIN) 100 MG tablet Take 100 mg by mouth daily.    . cetirizine (ZYRTEC) 10 MG tablet  Take 10 mg by mouth daily.    . clopidogrel (PLAVIX) 75 MG tablet Take 75 mg by mouth daily.    Marland Kitchen glucosamine-chondroitin 500-400 MG tablet Take 1 tablet by mouth 3 (three) times daily.    . hydrochlorothiazide (HYDRODIURIL) 25 MG tablet Take 25 mg by mouth daily.    . metoprolol succinate (TOPROL-XL) 100 MG 24 hr tablet Take 100 mg by mouth 2 (two) times daily. Take with or immediately following a meal.    . nitroGLYCERIN (NITROSTAT) 0.4 MG SL tablet Place 0.4 mg under the tongue every 5 (five) minutes as needed for chest pain.    . Omega-3 Fat Ac-Cholecalciferol (MINICAPS VITAMIN-D/OMEGA-3) [phone] MG-UNIT CAPS Take 2,000 Units by mouth.    . Probiotic Product (PROBIOTIC DAILY PO) Take by mouth.    . ranitidine (ZANTAC) 150 MG capsule Take 150 mg by mouth 2 (two) times daily.    . rosuvastatin (CRESTOR) 20 MG tablet Take 20 mg by mouth daily.    . colesevelam (WELCHOL) 625 MG tablet Take 1,875 mg by mouth 2 (two) times daily with a meal.     No current facility-administered medications for this visit.     Past Medical History:  Diagnosis Date  . AAA (abdominal aortic aneurysm) ([place]) 07/04/2003   3.1 cm  . Abdominal pain   . Acute pharyngitis   . Allergy    Rhinitis  . Anemia   .  Arthritis    Osteoarthritis, Right knee  . Asthma    Asthmatic Bronchitis  . Atrial fibrillation ([place])   . Benign neoplasm of colon   . CAD (coronary artery disease)   . Cancer ([place])    renal cell  . Carotid artery occlusion   . CHF (congestive heart failure) ([place])   . Chondromalacia of right knee   . Chronic kidney disease 08/16/04   and ureter, cyst,Malignant neoplasm of kidney  . COPD (chronic obstructive pulmonary disease) ([place]) Nov. 2000   Pulmonary nodule  . Coronary artery disease   . Hyperlipidemia   . Hypertension   . Myocardial infarction ([place])   . Pneumonia   . Prostatic hypertrophy   . Proteinuria   . Spinal stenosis   . 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 Stroke ([place])   . Ulcer  1985   Peptic w/o hemorrhage    Social History Social History  Substance Use Topics  . Smoking status: Former Smoker    Types: Cigarettes    Quit date: 07/03/2002  . Smokeless tobacco: Never Used  . Alcohol use No    Family History Family History  Problem Relation Age of Onset  . Heart disease Mother        before age 5  . Stroke Mother   . Hyperlipidemia Mother   . Hypertension Mother   . Other Mother        varicose veins  . Heart disease Father   . Stroke Father   . Cancer Father     Surgical History Past Surgical History:  Procedure Laterality Date  . CAROTID ENDARTERECTOMY    . Carotid Thromboendarterectomy Right Sept. 2000  . COLON SURGERY  Aug. 1, 2000   Benign neoplasm of colon at the ileocecal valve  . COLONOSCOPY  2009-2010-2012  . CORONARY ARTERY BYPASS GRAFT  Nov 01, 2002   X6  . ESOPHAGOGASTRODUODENOSCOPY  Nov. 13, 2008  . FRACTURE SURGERY Left    [place] of the left radius  . JOINT REPLACEMENT Left Nov. 5, 2013   Knee  . LAPAROSCOPIC PARTIAL COLECTOMY  January 13, 2008   W/Removal Terminal ileum, ilecolost   . NEPHRECTOMY Right Nov. 9, 2004   Renal tumor  . TURP VAPORIZATION      Allergies  Allergen Reactions  . Penicillins     Pt states when he was a child he had a reaction but has not had one since    Current Outpatient Prescriptions  Medication Sig Dispense Refill  . amLODipine-benazepril (LOTREL) 5-10 MG per capsule Take 1 capsule by mouth daily.    Marland Kitchen aspirin 81 MG tablet Take 81 mg by mouth daily.    Marland Kitchen atenolol (TENORMIN) 100 MG tablet Take 100 mg by mouth daily.    . cetirizine (ZYRTEC) 10 MG tablet Take 10 mg by mouth daily.    . clopidogrel (PLAVIX) 75 MG tablet Take 75 mg by mouth daily.    Marland Kitchen glucosamine-chondroitin 500-400 MG tablet Take 1 tablet by mouth 3 (three) times daily.    . hydrochlorothiazide (HYDRODIURIL) 25 MG tablet Take 25 mg by mouth daily.    . metoprolol succinate (TOPROL-XL) 100 MG 24 hr tablet Take 100 mg by mouth 2  (two) times daily. Take with or immediately following a meal.    . nitroGLYCERIN (NITROSTAT) 0.4 MG SL tablet Place 0.4 mg under the tongue every 5 (five) minutes as needed for chest pain.    . Omega-3 Fat Ac-Cholecalciferol (MINICAPS VITAMIN-D/OMEGA-3) [phone]  MG-UNIT CAPS Take 2,000 Units by mouth.    . Probiotic Product (PROBIOTIC DAILY PO) Take by mouth.    . ranitidine (ZANTAC) 150 MG capsule Take 150 mg by mouth 2 (two) times daily.    . rosuvastatin (CRESTOR) 20 MG tablet Take 20 mg by mouth daily.    . colesevelam (WELCHOL) 625 MG tablet Take 1,875 mg by mouth 2 (two) times daily with a meal.     No current facility-administered medications for this visit.      REVIEW OF SYSTEMS: See HPI for pertinent positives and negatives.  Physical Examination Vitals:   12/19/16 0940 12/19/16 0942  BP: (!) 159/76 129/79  Pulse: (!) 55   Resp: 20   Temp: 97.3 F (36.3 C)   TempSrc: Oral   SpO2: 94%   Weight: 201 lb (91.2 kg)   Height: 5' 10.5" (1.791 m)    Body mass index is 28.43 kg/m.  General:  WDWN in NAD Gait: Normal HENT: WNL Eyes: Pupils equal Pulmonary: normal non-labored breathing, good air movement, CTAB, without Rales, rhonchi,  wheezing Cardiac: Regular rhythm, bradycardic on a beta blocker, no murmur detected  Abdomen: soft, NT, no masses palpated Skin: no rashes, no ulcers, no cellulitis.   VASCULAR EXAM  Carotid Bruits Right Left   Positive Positive      Radial pulses are 3+ right and 2+ left palpable  Adominal aortic pulse is palpable                      VASCULAR EXAM: Extremities without ischemic changes, without Gangrene; without open wounds.                                                                                                          LE Pulses Right Left       FEMORAL  3+ palpable  3+ palpable        POPLITEAL  2+ palpable   1+ palpable       POSTERIOR TIBIAL  2+ palpable   2+ palpable        DORSALIS PEDIS      ANTERIOR TIBIAL 3+  palpable  2+ palpable     Musculoskeletal: no muscle wasting or atrophy; no peripheral edema  Neurologic: A&O X 3; appropriate affect, sensation is normal; motor function: 5/5 Symmetric, CN 2-12 intact, speech is fluent/normal.   ASSESSMENT:  James Barton is a 83 y.o. male who returns for evaluation of a small abdominal aortic aneurysm and s/p left carotid endarterectomy done at an outlying facility in 2000.  He has no abdominal pain or back pain. He remains asymptomatic of stroke or TIA.  He is aware of his history of left subclavian artery stenosis, and has no steal symptoms in either upper extremity.   His AAA remains small at 3.7 cm; incidental finding today on abdominal duplex was significant superior mesenteric artery stenosis; however, he denies post prandial abdominal pain, his weight has remained the same as 2 years ago, he denies food fear.     DATA  Carotid Duplex (12/19/16): Right ICA: CEA site with no restenosis. Left ICA: 1-39% stenosis. Left subclavian artery stenosis (489 cm/s). Bilateral vertebral artery flow is antegrade. Right subclavian artery waveforms are triphasic (252 cm/s). Left subclavian artery waveforms are monophasic. No significant change in the carotid arteries.  Increased stenosis of bilateral subclavian arteries, left greater than right, since previous exam on 12-22-14.   AAA Duplex (12/19/16): Aneurysmal dilatation of the distal abdominal aorta with a maximum diameter of 3.7 cm. Right CIA: 1.6 cm; Left CIA: 1.4 cm Previous AAA diameter 3.2 cm (12-22-14) Increase AAA by 5 mm in 2 years.  Proximal SMA velocity of 512 cm/s in the >70% stenosis range.    Aortoiliac Duplex and Limited LE Arterial Duplex (Date: 08-28-2012, from [facility] in [facility]): Infrarenal abdominal aortic aneurysm that measures 3.07 cm. Both iliac artery systems WNL. Right lower extremity: >50% stenosis of the mid SFA. (No mention of left LE)  ABI (Date:  08-28-2012, from [facility] in [facility]):  R:   ABI: 1.00 ("stable compared to prior exam")  PT: bi  DP: bi  TBI:  Not documented  L:   ABI: 0.71 ("stable compared to prior exam")  PT: bi  DP: bi  TBI: not documented    PLAN:   Based on today's exam and non-invasive vascular lab results, the patient will follow up in 2 years with the following tests: AAA duplex and carotid duplex. I advised him to notify us if he develops pain in his stomach after eating, and if he develops tingling, numbness, or difficulty using his left hand.   I discussed in depth with the patient the nature of atherosclerosis, and emphasized the importance of maximal medical management including strict control of blood pressure, blood glucose, and lipid levels, obtaining regular exercise, and cessation of smoking.  The patient is aware that without maximal medical management the underlying atherosclerotic disease process will progress, limiting the benefit of any interventions.  Consideration for repair of AAA would be made when the size approaches 4.8 or 5.0 cm, growth > 1 cm/yr, and symptomatic status. The patient was given information about AAA including signs, symptoms, treatment,  what symptoms should prompt the patient to seek immediate medical care, and how to minimize the risk of enlargement and rupture of aneurysms.   The patient was given information about stroke prevention and what symptoms should prompt the patient to seek immediate medical care.  Thank you for allowing [place] to participate in this patient's care.  Clemon Chambers, RN, MSN, FNP-C [facility] Office: [phone]  Clinic MD: Early 12/19/2016 1:56 PM

## 2016-12-19 NOTE — Patient Instructions (Addendum)
Abdominal Aortic Aneurysm Blood pumps away from the heart through tubes (blood vessels) called arteries. Aneurysms are weak or damaged places in the wall of an artery. It bulges out like a balloon. An abdominal aortic aneurysm happens in the main artery of the body (aorta). It can burst or tear, causing bleeding inside the body. This is an emergency. It needs treatment right away. What are the causes? The exact cause is unknown. Things that could cause this problem include:  Fat and other substances building up in the lining of a tube.  Swelling of the walls of a blood vessel.  Certain tissue diseases.  Belly (abdominal) trauma.  An infection in the main artery of the body.  What increases the risk? There are things that make it more likely for you to have an aneurysm. These include:  Being over the age of 80 years old.  Having high blood pressure (hypertension).  Being a male.  Being white.  Being very overweight (obese).  Having a family history of aneurysm.  Using tobacco products.  What are the signs or symptoms? Symptoms depend on the size of the aneurysm and how fast it grows. There may not be symptoms. If symptoms occur, they can include:  Pain (belly, side, lower back, or groin).  Feeling full after eating a small amount of food.  Feeling sick to your stomach (nauseous), throwing up (vomiting), or both.  Feeling a lump in your belly that feels like it is beating (pulsating).  Feeling like you will pass out (faint).  How is this treated?  Medicine to control blood pressure and pain.  Imaging tests to see if the aneurysm gets bigger.  Surgery. How is this prevented? To lessen your chance of getting this condition:  Stop smoking. Stop chewing tobacco.  Limit or avoid alcohol.  Keep your blood pressure, blood sugar, and cholesterol within normal limits.  Eat less salt.  Eat foods low in saturated fats and cholesterol. These are found in animal and  whole dairy products.  Eat more fiber. Fiber is found in whole grains, vegetables, and fruits.  Keep a healthy weight.  Stay active and exercise often.  This information is not intended to replace advice given to you by your health care provider. Make sure you discuss any questions you have with your health care provider. Document Released: 10/14/2012 Document Revised: 11/25/2015 Document Reviewed: 07/19/2012 Elsevier Interactive Patient Education  2017 [employer].     Stroke Prevention Some medical conditions and behaviors are associated with an increased chance of having a stroke. You may prevent a stroke by making healthy choices and managing medical conditions. How can I reduce my risk of having a stroke?  Stay physically active. Get at least 30 minutes of activity on most or all days.  Do not smoke. It may also be helpful to avoid exposure to secondhand smoke.  Limit alcohol use. Moderate alcohol use is considered to be: ? No more than 2 drinks per day for men. ? No more than 1 drink per day for nonpregnant women.  Eat healthy foods. This involves: ? Eating 5 or more servings of fruits and vegetables a day. ? Making dietary changes that address high blood pressure (hypertension), high cholesterol, diabetes, or obesity.  Manage your cholesterol levels. ? Making food choices that are high in fiber and low in saturated fat, trans fat, and cholesterol may control cholesterol levels. ? Take any prescribed medicines to control cholesterol as directed by your health care provider.  Manage your diabetes. ? Controlling your carbohydrate and sugar intake is recommended to manage diabetes. ? Take any prescribed medicines to control diabetes as directed by your health care provider.  Control your hypertension. ? Making food choices that are low in salt (sodium), saturated fat, trans fat, and cholesterol is recommended to manage hypertension. ? Ask your health care provider if you  need treatment to lower your blood pressure. Take any prescribed medicines to control hypertension as directed by your health care provider. ? If you are 47-54 years of age, have your blood pressure checked every 3-5 years. If you are 74 years of age or older, have your blood pressure checked every year.  Maintain a healthy weight. ? Reducing calorie intake and making food choices that are low in sodium, saturated fat, trans fat, and cholesterol are recommended to manage weight.  Stop drug abuse.  Avoid taking birth control pills. ? Talk to your health care provider about the risks of taking birth control pills if you are over 57 years old, smoke, get migraines, or have ever had a blood clot.  Get evaluated for sleep disorders (sleep apnea). ? Talk to your health care provider about getting a sleep evaluation if you snore a lot or have excessive sleepiness.  Take medicines only as directed by your health care provider. ? For some people, aspirin or blood thinners (anticoagulants) are helpful in reducing the risk of forming abnormal blood clots that can lead to stroke. If you have the irregular heart rhythm of atrial fibrillation, you should be on a blood thinner unless there is a good reason you cannot take them. ? Understand all your medicine instructions.  Make sure that other conditions (such as anemia or atherosclerosis) are addressed. Get help right away if:  You have sudden weakness or numbness of the face, arm, or leg, especially on one side of the body.  Your face or eyelid droops to one side.  You have sudden confusion.  You have trouble speaking (aphasia) or understanding.  You have sudden trouble seeing in one or both eyes.  You have sudden trouble walking.  You have dizziness.  You have a loss of balance or coordination.  You have a sudden, severe headache with no known cause.  You have new chest pain or an irregular heartbeat. Any of these symptoms may represent a  serious problem that is an emergency. Do not wait to see if the symptoms will go away. Get medical help at once. Call your local emergency services (911 in [place] Do not drive yourself to the hospital. This information is not intended to replace advice given to you by your health care provider. Make sure you discuss any questions you have with your health care provider. Document Released: 07/27/2004 Document Revised: 11/25/2015 Document Reviewed: 12/20/2012 Elsevier Interactive Patient Education  2017 [employer]      Chronic Mesenteric Ischemia Mesenteric ischemia is poor blood flow (circulation) in the vessels that supply blood to the stomach, intestines, and liver (mesenteric organs). Chronic mesenteric ischemia, also called mesenteric angina or intestinal angina, is a long-term (chronic) condition. It happens when an artery or vein that provides blood to the mesenteric organs gradually becomes blocked or narrow, restricting the blood supply to the organs. When the blood supply is severely restricted, the mesenteric organs cannot work properly. What are the causes? This condition is commonly caused by fatty deposits that build up in an artery (plaque), which can narrow the artery and restrict blood flow. Other  causes include:  Weakened areas in blood vessel walls (aneurysms).  Conditions that cause twisting or inflammation of blood vessels, such as fibromuscular dysplasia or arteritis.  A disorder in which blood clots form in the veins (venous thrombosis).  Scarring and thickening (fibrosis) of blood vessels caused by radiation therapy.  A tear in the aorta, the body's main artery (aortic dissection).  Blood vessel problems after illegal drug use, such as use of cocaine.  Tumors in the nervous system (neurofibromatosis).  Certain autoimmune diseases, such as lupus.  What increases the risk? The following factors may make you more likely to develop this condition:  Being  male.  Being over age 91, especially if you have a history of heart problems.  Smoking.  Congestive heart failure.  Irregular heartbeat (arrhythmia).  Having a history of heart attack or stroke.  Diabetes.  High cholesterol.  High blood pressure (hypertension).  Being overweight or obese.  Kidney disease (renal disease) requiring dialysis.  What are the signs or symptoms? Symptoms of this condition include:  Abdomen (abdominal) pain or cramps that develop 15-60 minutes after a meal. This pain may last for 1-3 hours. Some people may develop a fear of eating because of this symptom.  Weight loss.  Diarrhea.  Bloody stool.  Nausea.  Vomiting.  Bloating.  Abdominal pain after stress or with exercise.  How is this diagnosed? This condition is diagnosed based on:  Your medical history.  A physical exam.  Tests, such as: ? Ultrasound. ? CT scan. ? Blood tests. ? Urine tests. ? An imaging test that involves injecting a dye into your arteries to show blood flow through blood vessels (angiogram). This can help to show if there are any blockages in the vessels that lead to the intestines. ? Passing a small probe through the mouth and into the stomach to measure the output of carbon dioxide (gastric tonometry). This can help to indicate whether there is decreased blood flow to the stomach and intestines.  How is this treated? This condition may be treated with:  Dietary changes such as eating smaller, low-fat, meals more frequently.  Lifestyle changes to treat underlying conditions that contribute to the disease, such as high cholesterol and high blood pressure.  Medicines to reduce blood clotting and increase blood flow.  Surgery to remove the blockage, repair arteries or veins, and restore blood flow. This may involve: ? Angioplasty. This is surgery to widen the affected artery, reduce the blockage, and sometimes insert a small, mesh tube (stent). ? Bypass  surgery. This may be done to go around (bypass) the blockage and reconnect healthy arteries or veins. ? Placing a stent in the affected area. This may be done to help keep blocked arteries open.  Follow these instructions at home: Eating and drinking  Eat a heart-healthy diet. This includes fresh fruits and vegetables, whole grains, and lean proteins like chicken, fish, eggs, and beans.  Avoid foods that contain a lot of: ? Salt (sodium). ? Sugar. ? Saturated fat (such as red meat). ? Trans fat (such as fried foods).  Stay hydrated. Drink enough fluid to keep your urine clear or pale yellow. Lifestyle  Stay active and get regular exercise as told by your health care provider. Aim for 150 minutes of moderate activity or 75 minutes of vigorous activity a week. Ask your health care provider what activities and forms of exercise are safe for you.  Maintain a healthy weight.  Work with your health care provider to manage  your cholesterol.  Manage any other health problems you have, such as high blood pressure, diabetes, or heart rhythm problems.  Do not use any products that contain nicotine or tobacco, such as cigarettes and e-cigarettes. If you need help quitting, ask your health care provider. General instructions  Take over-the-counter and prescription medicines only as told by your health care provider.  Keep all follow-up visits as told by your health care provider. This is important. Contact a health care provider if:  Your symptoms do not improve or they return after treatment.  You have a fever. Get help right away if:  You have severe abdominal pain.  You have severe chest pain.  You have shortness of breath.  You feel weak or dizzy.  You have palpitations.  You have numbness or weakness in your face, arm, or leg.  You are confused.  You have trouble speaking or people have trouble understanding what you are saying.  You are constipated.  You have trouble  urinating.  You have blood in your stool.  You have severe nausea, vomiting, or persistent diarrhea. Summary  Mesenteric ischemia is poor circulation in the vessels that supply blood to the the stomach, intestines, and liver (mesenteric organs).  This condition happens when an artery or vein that provides blood to the mesenteric organs gradually becomes blocked or narrow, restricting the blood supply to the organs.  This condition is commonly caused by fatty deposits that build up in an artery (plaque), which can narrow the artery and restrict blood flow.  You are more likely to develop this condition if you are over age 66 and have a history of heart problems, high blood pressure, diabetes, or high cholesterol.  This condition is usually treated with medicines, dietary and lifestyle changes, and surgery to remove the blockage, repair arteries or veins, and restore blood flow. This information is not intended to replace advice given to you by your health care provider. Make sure you discuss any questions you have with your health care provider. Document Released: 02/06/2011 Document Revised: 06/03/2016 Document Reviewed: 06/03/2016 Elsevier Interactive Patient Education  2017 [employer].

## 2018-12-13 ENCOUNTER — Other Ambulatory Visit: Payer: Self-pay

## 2018-12-13 DIAGNOSIS — I714 Abdominal aortic aneurysm, without rupture, unspecified: Secondary | ICD-10-CM

## 2018-12-13 DIAGNOSIS — I6523 Occlusion and stenosis of bilateral carotid arteries: Secondary | ICD-10-CM

## 2018-12-16 ENCOUNTER — Telehealth (HOSPITAL_COMMUNITY): Payer: Self-pay | Admitting: Rehabilitation

## 2018-12-16 NOTE — Telephone Encounter (Signed)
The above patient or their representative was contacted and gave the following answers to these questions:         Do you have any of the following symptoms? No Fever                    Cough                   Shortness of breath  Do  you have any of the following other symptoms? No  muscle pain         vomiting,        diarrhea        rash         weakness        red eye        abdominal pain         bruising         bleeding              joint pain           severe headache  Have you been in contact with someone who was or has been sick in the past 2 weeks? No Yes                 Unsure                         Unable to assess   Does the person that you were in contact with have any of the following symptoms?  Cough         shortness of breath           muscle pain         vomiting,            diarrhea            rash            weakness           fever            red eye           abdominal pain          bruising  or  bleeding                joint pain                severe headache             Have you  or someone you have been in contact with traveled internationally in the last month?  No      If yes, which countries?  Have you  or someone you have been in contact with traveled outside [place] in the last month?  No      If yes, which state and city?  COMMENTS OR ACTION PLAN FOR THIS PATIENT:    

## 2018-12-17 ENCOUNTER — Other Ambulatory Visit: Payer: Self-pay

## 2018-12-17 ENCOUNTER — Ambulatory Visit: Payer: Medicare Other | Admitting: Family

## 2018-12-17 ENCOUNTER — Ambulatory Visit (INDEPENDENT_AMBULATORY_CARE_PROVIDER_SITE_OTHER)
Admission: RE | Admit: 2018-12-17 | Discharge: 2018-12-17 | Disposition: A | Discharge status: Home / Self Care | Payer: Medicare Other | Source: Ambulatory Visit | Attending: Family | Admitting: Family

## 2018-12-17 ENCOUNTER — Ambulatory Visit (HOSPITAL_COMMUNITY)
Admission: RE | Admit: 2018-12-17 | Discharge: 2018-12-17 | Disposition: A | Discharge status: Home / Self Care | Payer: Medicare Other | Source: Ambulatory Visit | Attending: Family | Admitting: Family

## 2018-12-17 DIAGNOSIS — I714 Abdominal aortic aneurysm, without rupture, unspecified: Secondary | ICD-10-CM

## 2018-12-17 DIAGNOSIS — I6523 Occlusion and stenosis of bilateral carotid arteries: Secondary | ICD-10-CM | POA: Insufficient documentation

## 2018-12-20 ENCOUNTER — Telehealth: Payer: Self-pay | Admitting: *Deleted

## 2018-12-20 NOTE — Telephone Encounter (Signed)
Virtual Visit Pre-Appointment Phone Call  Today, I spoke with James Barton and performed the following actions:  1. I explained that we are currently trying to limit exposure to the COVID-19 virus by seeing patients at home rather than in the office.  I explained that the visits are best done by video, but can be done by telephone.  I asked the patient if a virtual visit that the patient would like to try instead of coming into the office. James Barton agreed to proceed with the virtual visit scheduled with NP Vinnie Level Nickel on 12/23/18.     2. I confirmed the [facility] phone number to call the day of the visit and- I included this in appointment notes.  3. I asked if the patient had access to (through a family member/friend) a smartphone with video capability to be used for his visit?"  The patient said yes -   4.       If the patient said yes, I documented "VIDEO" in the appointment notesI confirmed consent by  a. sending through [facility] or by email the [place] as written at the end of this message or  b. verbally as listed below. i. This visit is being performed in the setting of COVID-19. ii. All virtual visits are billed to your insurance company just like a normal visit would be.   iii. We'd like you to understand that the technology does not allow for your provider to perform an examination, and thus may limit your provider's ability to fully assess your condition.  iv. If your provider identifies any concerns that need to be evaluated in person, we will make arrangements to do so.   v. Finally, though the technology is pretty good, we cannot assure that it will always work on either your or our end, and in the setting of a video visit, we may have to convert it to a phone-only visit.  In either situation, we cannot ensure that we have a secure connection.   vi. Are you willing to proceed?"  STAFF: Did the patient verbally acknowledge consent to  telehealth visit? Document YES/NO here:  2. I advised the patient to be prepared - I asked that the patient, on the day of his visit, record any information possible with the equipment at his home, such as blood pressure, pulse, oxygen saturation, and your weight and write them all down. I asked the patient to have a pen and paper handy nearby the day of the visit as well.  3. If the patient was scheduled for a video visit, I informed the patient that the visit with the doctor would start with a text to the smartphone # given to [place] by the patient.         If the patient was scheduled for a telephone call, I informed the patient that the visit with the doctor would start with a call to the telephone # given to [place] by the patient.  4. I Informed patient they will receive a phone call 15 minutes prior to their appointment time from a [place] or nurse to review medications, allergies, etc. to prepare for the visit.    TELEPHONE CALL NOTE  Rudra Hobbins has been deemed a candidate for a follow-up tele-health visit to limit community exposure during the Covid-19 pandemic. I spoke with the patient via phone to ensure availability of phone/video source, confirm preferred email & phone number, and discuss instructions and expectations.  I reminded James Barton to be prepared with any vital sign and/or heart rhythm information that could potentially be obtained via home monitoring, at the time of his visit. I reminded James Barton to expect a phone call prior to his visit.  James Barton, NT 12/20/2018 12:37 PM     FULL LENGTH CONSENT FOR TELE-HEALTH VISIT   I hereby voluntarily request, consent and authorize [facility] and its employed or contracted physicians, physician assistants, nurse practitioners or other licensed health care professionals (the Practitioner), to provide me with telemedicine health care services (the "Services") as deemed necessary by the treating Practitioner. I  acknowledge and consent to receive the Services by the Practitioner via telemedicine. I understand that the telemedicine visit will involve communicating with the Practitioner through live audiovisual communication technology and the disclosure of certain medical information by electronic transmission. I acknowledge that I have been given the opportunity to request an in-person assessment or other available alternative prior to the telemedicine visit and am voluntarily participating in the telemedicine visit.  I understand that I have the right to withhold or withdraw my consent to the use of telemedicine in the course of my care at any time, without affecting my right to future care or treatment, and that the Practitioner or I may terminate the telemedicine visit at any time. I understand that I have the right to inspect all information obtained and/or recorded in the course of the telemedicine visit and may receive copies of available information for a reasonable fee.  I understand that some of the potential risks of receiving the Services via telemedicine include:  [place] Delay or interruption in medical evaluation due to technological equipment failure or disruption; . Information transmitted may not be sufficient (e.g. poor resolution of images) to allow for appropriate medical decision making by the Practitioner; and/or  . In rare instances, security protocols could fail, causing a breach of personal health information.  Furthermore, I acknowledge that it is my responsibility to provide information about my medical history, conditions and care that is complete and accurate to the best of my ability. I acknowledge that Practitioner's advice, recommendations, and/or decision may be based on factors not within their control, such as incomplete or inaccurate data provided by me or distortions of diagnostic images or specimens that may result from electronic transmissions. I understand that the practice of  medicine is not an exact science and that Practitioner makes no warranties or guarantees regarding treatment outcomes. I acknowledge that I will receive a copy of this consent concurrently upon execution via email to the email address I last provided but may also request a printed copy by calling the office of [facility].    I understand that my insurance will be billed for this visit.   I have read or had this consent read to me. . I understand the contents of this consent, which adequately explains the benefits and risks of the Services being provided via telemedicine.  . I have been provided ample opportunity to ask questions regarding this consent and the Services and have had my questions answered to my satisfaction. . I give my informed consent for the services to be provided through the use of telemedicine in my medical care  By participating in this telemedicine visit I agree to the above.

## 2018-12-23 ENCOUNTER — Other Ambulatory Visit: Payer: Self-pay

## 2018-12-23 ENCOUNTER — Ambulatory Visit (INDEPENDENT_AMBULATORY_CARE_PROVIDER_SITE_OTHER): Payer: Medicare Other | Admitting: Family

## 2018-12-23 ENCOUNTER — Encounter: Payer: Self-pay | Admitting: Family

## 2018-12-23 VITALS — BP 135/85 | Ht 71.0 in | Wt 202.0 lb

## 2018-12-23 DIAGNOSIS — K551 Chronic vascular disorders of intestine: Secondary | ICD-10-CM

## 2018-12-23 DIAGNOSIS — I6523 Occlusion and stenosis of bilateral carotid arteries: Secondary | ICD-10-CM

## 2018-12-23 DIAGNOSIS — I771 Stricture of artery: Secondary | ICD-10-CM

## 2018-12-23 DIAGNOSIS — I714 Abdominal aortic aneurysm, without rupture, unspecified: Secondary | ICD-10-CM

## 2018-12-23 NOTE — Progress Notes (Signed)
Virtual Visit via Video Note   I connected with James Barton on 12/23/2018 using the [employer] virtual platform and verified that I was speaking with the correct person using two identifiers. Patient was located at his home and accompanied by himself. I am located at the [facility] office/clinic on [facility] [place], [place].   The limitations of evaluation and management by telemedicine and the availability of in person appointments have been previously discussed with the patient and are documented in the patients chart. The patient expressed understanding and consented to proceed.   PCP: Drake Leach, MD  Chief Complaint: follow up extracranial carotid artery stenosis and small AAA  History of Present Illness: James Barton is a 84 y.o. male with a history of a right carotid endarterectomy done at an outlying facility in 2000.  He is also following up for a small asymptomatic AAA.    Dr. Donnetta Hutching last evaluated pt on 12-22-14. At that time vascular studies revealed maximal diameter of 3.2 cm of his infrarenal aneurysm as compared to 3.0 cm in 2015. Carotid studies revealed no evidence of internal carotid artery stenosis bilaterally. Dr. Donnetta Hutching recommended 2 years follow up.   Noted on his AAA duplex in September 2016 was a proximal SMA velocity of 512 cm/s in the >70% stenosis range. His weight has remained stable.  He states that he has the same bowel irregularity as his father and son, states this has been evaluated by a gastroenterologist.  Pt denies postprandial abdominal pain, denies food fear.   He denies fatigue or pain in his legs with walking.   He denies tingling or numbness, or pain, or cold sensation in either hand or arm. He states he has known about this left subclavian artery stenosis for years.  He states his blood pressure at home is [phone] systolic in his right arm, and he is aware that his left arm pressure is falsely lower than his right arm, right arm is the accurate  blood pressure.     He has had surgery on his lumbar spine a few years ago.  His knee issues seem to limit his walking, but he is still able to do most of what he wants, he plays golf.   In late 2019 he suddenly lost hearing in his left ear; he has had testing to evaluate this. He denies any other neurological deficits.   He had a TURP, feels he is improving since this.   Diabetic: No Tobacco use: former smoker, quit in 2004, started at age 13 years. He states that he quit when he had an MI.   Pt meds include: Statin :Yes Betablocker: Yes ASA: Yes Other anticoagulants/antiplatelets: Plavix   Past Medical History:  Diagnosis Date  . AAA (abdominal aortic aneurysm) ([place]) 07/04/2003   3.1 cm  . Abdominal pain   . Acute pharyngitis   . Allergy    Rhinitis  . Anemia   . Arthritis    Osteoarthritis, Right knee  . Asthma    Asthmatic Bronchitis  . Atrial fibrillation ([place])   . Benign neoplasm of colon   . CAD (coronary artery disease)   . Cancer ([place])    renal cell  . Carotid artery occlusion   . CHF (congestive heart failure) ([place])   . Chondromalacia of right knee   . Chronic kidney disease 08/16/04   and ureter, cyst,Malignant neoplasm of kidney  . COPD (chronic obstructive pulmonary disease) ([place]) Nov. 2000   Pulmonary nodule  . Coronary artery disease   .  Hyperlipidemia   . Hypertension   . Myocardial infarction ([place])   . Pneumonia   . Prostatic hypertrophy   . Proteinuria   . Spinal stenosis   . 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 Stroke ([place])   . Ulcer 1985   Peptic w/o hemorrhage    Past Surgical History:  Procedure Laterality Date  . CAROTID ENDARTERECTOMY    . Carotid Thromboendarterectomy Right Sept. 2000  . COLON SURGERY  Aug. 1, 2000   Benign neoplasm of colon at the ileocecal valve  . COLONOSCOPY  2009-2010-2012  . CORONARY ARTERY BYPASS GRAFT  Nov 01, 2002   X6  . ESOPHAGOGASTRODUODENOSCOPY  Nov. 13, 2008  . FRACTURE SURGERY Left    Ulna of  the left radius  . JOINT REPLACEMENT Left Nov. 5, 2013   Knee  . LAPAROSCOPIC PARTIAL COLECTOMY  January 13, 2008   W/Removal Terminal ileum, ilecolost   . NEPHRECTOMY Right Nov. 9, 2004   Renal tumor  . TURP VAPORIZATION      Current Meds  Medication Sig  . amLODipine-benazepril (LOTREL) 5-10 MG per capsule Take 1 capsule by mouth daily.  Marland Kitchen aspirin 81 MG tablet Take 81 mg by mouth daily.  Marland Kitchen atenolol (TENORMIN) 100 MG tablet Take 100 mg by mouth daily.  . clopidogrel (PLAVIX) 75 MG tablet Take 75 mg by mouth daily.  . colesevelam (WELCHOL) 625 MG tablet Take 1,875 mg by mouth 2 (two) times daily with a meal.  . Famotidine (PEPCID PO) Take by mouth daily.  . hydrochlorothiazide (HYDRODIURIL) 25 MG tablet Take 25 mg by mouth daily.  . metoprolol succinate (TOPROL-XL) 100 MG 24 hr tablet Take 100 mg by mouth 2 (two) times daily. Take with or immediately following a meal.  . Probiotic Product (PROBIOTIC DAILY PO) Take by mouth.  . rosuvastatin (CRESTOR) 20 MG tablet Take 20 mg by mouth daily.    12 system ROS was negative unless otherwise noted in HPI  Observations/Objective:  DATA  Carotid Duplex (12-17-18): Right Carotid: Patent right carotid endarterectomy site with no evidence for restenosis. Left Carotid: Velocities in the left ICA are consistent with a 1-39% stenosis. Vertebrals:  Bilateral vertebral arteries demonstrate antegrade flow. Subclavians: Left subclavian artery was stenotic. Right brachial triphasic 100 cm/s, Left brachial 65 cm/s monophasic waveform. No significant change compared to the exam on 12-19-16.   AAA Duplex (12-17-18): Abdominal Aorta Findings: +-----------+-------+----------+----------+--------+--------+--------+ Location   AP (cm)Trans (cm)PSV (cm/s)WaveformThrombusComments +-----------+-------+----------+----------+--------+--------+--------+ Proximal   2.46   2.44      79        biphasic                  +-----------+-------+----------+----------+--------+--------+--------+ Mid        3.69   3.67      77        biphasic                 +-----------+-------+----------+----------+--------+--------+--------+ Distal     3.70   3.70      90        biphasic                 +-----------+-------+----------+----------+--------+--------+--------+ RT CIA Prox1.4    1.5       120       biphasic                 +-----------+-------+----------+----------+--------+--------+--------+ LT CIA Prox1.2    1.1       178  biphasic                 +-----------+-------+----------+----------+--------+--------+--------+  SMA > 407 cm/s (>70 stenosis). Summary: Abdominal Aorta: The largest aortic diameter remains essentially unchanged ( 3.7 x 3.7 cm) compared to prior exam. Previous diameter measurement was 3.7 cm obtained on 12/19/2016.  AAA Duplex (12/19/16): Aneurysmal dilatation of the distal abdominal aorta with a maximum diameter of 3.7 cm. Right CIA: 1.6 cm; Left CIA: 1.4 cm Previous AAA diameter 3.2 cm (12-22-14) Increase AAA by 5 mm in 2 years.  Proximal SMA velocity of 512 cm/s in the >70% stenosis range.     ABI (Date: 08-28-2012, from [facility] in [facility] [place]):  R:  ? ABI: 1.00 ("stable compared to prior exam") ? PT: bi ? DP: bi ? TBI:  Not documented  L:  ? ABI: 0.71 ("stable compared to prior exam") ? PT: bi ? DP: bi ? TBI: not documented    Assessment and Plan:   - Asymptomatic bilateral carotid artery stenosis with remote hx of right CEA: carotid duplex on 12-17-18 shows no restenosis in the right ICA, 1-39% stenosis in the left ICA.    - Asymptomatic left subclavian artery stenosis: He states his blood pressure is [phone] systolic in his right arm at home, and he is aware that his left arm pressure is falsely lower than his right arm, right arm is the accurate blood pressure. He has an asymptomatic left subclavian artery stenosis. He  has been aware of this for years.    -  Small asymptomatic AAA and asymptomatic SMA stenosis : His AAA remains small at 3.7 cm; incidental finding in September 2018 on abdominal duplex was significant superior mesenteric artery stenosis; however, he remains asymptomatic of this.     Follow Up Instructions:  Follow up in 2 years with carotid duplex and AAA duplex.   I advised him to notify us if he develops pain in his stomach after eating, and if he develops tingling, numbness, or difficulty using his left hand.    I discussed the assessment and treatment plan with the patient. The patient was provided an opportunity to ask questions and all were answered. The patient agreed with the plan and demonstrated an understanding of the instructions.   The patient was advised to call back or seek an in-person evaluation if the symptoms worsen or if the condition fails to improve as anticipated.  I spent 15 minutes with the patient via phone encounter, since video connection was attempted but unable to connect via video after doxy invitation was sent to pt.   Gabrielle Dare [facility] Office: [phone]  12/23/2018, 9:18 AM

## 2019-07-31 ENCOUNTER — Ambulatory Visit: Payer: Medicare Other
